# Patient Record
Sex: Male | Born: 1995 | Race: White | Hispanic: Yes | Marital: Single | Smoking: Current some day smoker
Health system: Southern US, Community
[De-identification: ages and names within clinical notes are randomized; demographics above are authoritative.]

---
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
## 2015-09-09 ENCOUNTER — Encounter (HOSPITAL_COMMUNITY): Payer: Self-pay | Admitting: Emergency Medicine

## 2015-09-09 DIAGNOSIS — R000 Tachycardia, unspecified: Secondary | ICD-10-CM | POA: Insufficient documentation

## 2015-09-09 DIAGNOSIS — J020 Streptococcal pharyngitis: Secondary | ICD-10-CM | POA: Insufficient documentation

## 2015-09-09 LAB — RAPID STREP SCREEN (MED CTR MEBANE ONLY): Streptococcus, Group A Screen (Direct): POSITIVE — AB

## 2015-09-09 MED ORDER — ACETAMINOPHEN 325 MG PO TABS
ORAL_TABLET | ORAL | Status: AC
  Filled 2015-09-09: qty 2

## 2015-09-09 MED ORDER — ACETAMINOPHEN 325 MG PO TABS
650.0000 mg | ORAL_TABLET | Freq: Once | ORAL | Status: AC | PRN
  Administered 2015-09-09: 650 mg via ORAL

## 2015-09-09 NOTE — ED Notes (Signed)
Pt c/o sore throat x 1 week

## 2015-09-10 ENCOUNTER — Emergency Department (HOSPITAL_COMMUNITY): Payer: Self-pay

## 2015-09-10 ENCOUNTER — Encounter (HOSPITAL_COMMUNITY): Payer: Self-pay | Admitting: Radiology

## 2015-09-10 ENCOUNTER — Emergency Department (HOSPITAL_COMMUNITY)
Admission: EM | Admit: 2015-09-10 | Discharge: 2015-09-10 | Disposition: A | Discharge status: Home / Self Care | Payer: Self-pay | Attending: Emergency Medicine | Admitting: Emergency Medicine

## 2015-09-10 DIAGNOSIS — J020 Streptococcal pharyngitis: Secondary | ICD-10-CM

## 2015-09-10 LAB — CBC WITH DIFFERENTIAL/PLATELET
BASOS ABS: 0 10*3/uL (ref 0.0–0.1)
Basophils Relative: 0 %
Eosinophils Absolute: 0.1 10*3/uL (ref 0.0–0.7)
Eosinophils Relative: 1 %
HCT: 43.9 % (ref 39.0–52.0)
Hemoglobin: 15.1 g/dL (ref 13.0–17.0)
LYMPHS ABS: 2.4 10*3/uL (ref 0.7–4.0)
Lymphocytes Relative: 18 %
MCH: 29.6 pg (ref 26.0–34.0)
MCHC: 34.4 g/dL (ref 30.0–36.0)
MCV: 86.1 fL (ref 78.0–100.0)
MONO ABS: 1.1 10*3/uL — AB (ref 0.1–1.0)
MONOS PCT: 8 %
NEUTROS PCT: 73 %
Neutro Abs: 9.7 10*3/uL — ABNORMAL HIGH (ref 1.7–7.7)
PLATELETS: 316 10*3/uL (ref 150–400)
RBC: 5.1 MIL/uL (ref 4.22–5.81)
RDW: 12.2 % (ref 11.5–15.5)
WBC: 13.3 10*3/uL — AB (ref 4.0–10.5)

## 2015-09-10 LAB — BASIC METABOLIC PANEL
Anion gap: 11 (ref 5–15)
BUN: 12 mg/dL (ref 6–20)
CALCIUM: 9.4 mg/dL (ref 8.9–10.3)
CHLORIDE: 103 mmol/L (ref 101–111)
CO2: 24 mmol/L (ref 22–32)
CREATININE: 0.92 mg/dL (ref 0.61–1.24)
GFR calc non Af Amer: >60 mL/min (ref >60)
Glucose, Bld: 95 mg/dL (ref 65–99)
Potassium: 4.1 mmol/L (ref 3.5–5.1)
Sodium: 138 mmol/L (ref 135–145)

## 2015-09-10 MED ORDER — SODIUM CHLORIDE 0.9 % IV BOLUS (SEPSIS)
1000.0000 mL | Freq: Once | INTRAVENOUS | Status: AC
  Administered 2015-09-10: 1000 mL via INTRAVENOUS

## 2015-09-10 MED ORDER — CLINDAMYCIN HCL 300 MG PO CAPS: 300.0000 mg | ORAL_CAPSULE | Freq: Four times a day (QID) | ORAL | Status: AC

## 2015-09-10 MED ORDER — IOHEXOL 300 MG/ML  SOLN
75.0000 mL | Freq: Once | INTRAMUSCULAR | Status: AC | PRN
  Administered 2015-09-10: 100 mL via INTRAVENOUS

## 2015-09-10 MED ORDER — CLINDAMYCIN PHOSPHATE 600 MG/50ML IV SOLN
600.0000 mg | Freq: Once | INTRAVENOUS | Status: AC
  Administered 2015-09-10: 600 mg via INTRAVENOUS
  Filled 2015-09-10: qty 50

## 2015-09-10 NOTE — Discharge Instructions (Signed)

## 2015-09-10 NOTE — ED Notes (Signed)
Pt will be moved, peritonsillar abscess

## 2015-09-10 NOTE — ED Provider Notes (Signed)
CSN: [id number]     Arrival date & time 09/09/15  2234 History   First MD Initiated Contact with Patient 09/10/15 0058     Chief Complaint  Patient presents with  . Sore Throat   (Consider location/radiation/quality/duration/timing/severity/associated sxs/prior Treatment) Patient is a 20 y.o. male presenting with pharyngitis. The history is provided by the patient. No language interpreter was used.  Sore Throat Associated symptoms include a fever and a sore throat.    Dennis Christian is a 20 year old male with no significant past medical history who presents for gradual onset and worsening sore throat 1 week. He also reports a fever. Worse with swallowing. He denies any other symptoms including cough, ear pain, shortness of breath. Denies having this in the past. States he took DayQuil, NyQuil, and Aleve with minimal relief.   History reviewed. No pertinent past medical history. History reviewed. No pertinent past surgical history. No family history on file. Social History  Substance Use Topics  . Smoking status: Never Smoker   . Smokeless tobacco: None  . Alcohol Use: No    Review of Systems  Constitutional: Positive for fever.  HENT: Positive for sore throat.   All other systems reviewed and are negative.     Allergies  Review of patient's allergies indicates no known allergies.  Home Medications   Prior to Admission medications   Not on File   BP 145/82 mmHg  Pulse 95  Temp(Src) 98.8 F (37.1 C) (Oral)  Resp 18  Wt 102.921 kg  SpO2 100% Physical Exam  Constitutional: He is oriented to person, place, and time. He appears well-developed and well-nourished.  HENT:  Head: Normocephalic and atraumatic.  Bilateral tonsillar edema and erythema but no kissing tonsils. No hot potato voice. No drooling or trismus. He has fullness and erythema of the posterior left sided soft palate near the tonsil. Uvula midline. No anterior cervical lymphadenopathy. No difficulty handling  secretions.  Eyes: Conjunctivae are normal.  Neck: Normal range of motion. Neck supple.  Cardiovascular: Tachycardia present.   No murmur heard. Pulmonary/Chest: Effort normal and breath sounds normal. No respiratory distress. He has no wheezes. He has no rales.  Musculoskeletal: Normal range of motion.  Lymphadenopathy:    He has no cervical adenopathy.  Neurological: He is alert and oriented to person, place, and time.  Skin: Skin is warm and dry.  Nursing note and vitals reviewed.   ED Course  Procedures (including critical care time) Labs Review Labs Reviewed  RAPID STREP SCREEN (NOT AT [facility]) - Abnormal; Notable for the following:    Streptococcus, Group A Screen (Direct) POSITIVE (*)    All other components within normal limits  CBC WITH DIFFERENTIAL/PLATELET  BASIC METABOLIC PANEL    Imaging Review No results found. I have personally reviewed and evaluated these images and lab results as part of my medical decision-making.   EKG Interpretation None      MDM   Final diagnoses:  Strep pharyngitis  Patient presents for fever and sore throat 1 week. No other symptoms. Tolerating his own secretions. Uvula is midline. Concerns for peritonsillar abscess due to soft palate erythema. Patient had temperature of 100.3. He was started on IV fluids and IV antibiotics. I discussed findings with the patient as well as need for CT.  I discussed this patient with Dr. Bebe Shaggy who is seen and evaluated the patient. He is aware that CT is pending and will follow up. Medications  clindamycin (CLEOCIN) IVPB 600 mg (600 mg Intravenous New  Bag/Given 09/10/15 0146)  acetaminophen (TYLENOL) tablet 650 mg (650 mg Oral Given 09/09/15 2259)  sodium chloride 0.9 % bolus 1,000 mL (1,000 mLs Intravenous New Bag/Given 09/10/15 0120)   Filed Vitals:   09/09/15 2253 09/10/15 0103  BP: 146/83 145/82  Pulse: 120 95  Temp: 100.3 F (37.9 C) 98.8 F (37.1 C)  Resp: 16 [address]18       Dennis Dunton  Patel-Mills, Dennis Christian 09/10/15 0148  Zadie Rhineonald Wickline, MD 09/10/15 [phone]

## 2015-09-10 NOTE — ED Notes (Signed)
See EDP assessment 

## 2016-05-23 ENCOUNTER — Encounter (HOSPITAL_COMMUNITY): Payer: Self-pay | Admitting: Emergency Medicine

## 2016-05-23 ENCOUNTER — Emergency Department (HOSPITAL_COMMUNITY)
Admission: EM | Admit: 2016-05-23 | Discharge: 2016-05-23 | Disposition: A | Discharge status: Home / Self Care | Payer: Medicaid Other | Attending: Emergency Medicine | Admitting: Emergency Medicine

## 2016-05-23 DIAGNOSIS — R040 Epistaxis: Secondary | ICD-10-CM | POA: Insufficient documentation

## 2016-05-23 MED ORDER — OXYMETAZOLINE HCL 0.05 % NA SOLN
1.0000 | Freq: Once | NASAL | Status: AC
  Administered 2016-05-23: 1 via NASAL
  Filled 2016-05-23: qty 15

## 2016-05-23 MED ORDER — LIDOCAINE-EPINEPHRINE-TETRACAINE (LET) SOLUTION
3.0000 mL | Freq: Once | NASAL | Status: AC
  Administered 2016-05-23: 3 mL via TOPICAL
  Filled 2016-05-23: qty 3

## 2016-05-23 NOTE — ED Notes (Signed)
[facility] COUPON 209 GIVEN

## 2016-05-23 NOTE — Discharge Instructions (Signed)
Nosebleeds commonly recur.  If your nose starts bleeding again and doesn't stop after 15-20 minutes of constant pressure squeezing the soft part of your nose, then re-apply the direct pressure and return to the ED. Return also immediately to the nearest ED if you develop chest pain, shortness of breath, dizziness or fainting, severe bleeding, or other concerns.  Nasal Hygiene The nose has many positive effects on the air you breathe in that you may not be aware of. - Temperature regulation - Filtration and removal of particulate matter - Humidification - Defense against infections There are several things you can do to help keep your nose healthy. Foremost is nasal hygiene. This will help with your nose's natural function and keep it moist and healthy. Techniques to accomplish this are outlined below. These will help with nasal dryness, nasal crusting, and nose bleeds. They also assist with clearing thick mucus that cause you to blow your nose frequently and may be associated with thick postnasal drip.  1. Use nasal saline daily. You can buy small bottles of this over-the-counter at the drug store or grocery store. Some brand names are: [address], [employer] Mist, [place]. Apply 2-3 sprays each nostril several times a day. If your nose feels dry or have had recent nasal surgery, try to use it every couple of hours. There is no medicine in it so it can be used as often as you like. Do NOT use the sprays containing decongestants. The appropriate way to apply nasal sprays: Place the nozzle just inside your nostril and point it towards the corner of your eye. Often, it is helpful to use the right-hand to spray into the left nasal cavity and use the left-hand to spray into the right side.  2. Use nasal saline irrigations and flushing.SinuCleanse, Simply Saline, Ayr. Use this 1-2 times a day. We can also provide you with a recipe to use at home. Just ask us. To prevent reintroducing bacteria back into  your nose, please keep your irrigation equipment clean and dry between uses. Throw away and replace reusable irrigation equipment every 3 weeks.   3. Use Vaseline petroleum jelly or Aquaphor. You can apply this gently to each nostril 2-3 times a day to promote moisturization for your nose. You may also use triple antibiotic ointment such as Neosporin or Bacitracin. These can all be bought over-the-counter.  4. Consider other nasal emollients. A few preparations are available over-thecounter. Ponaris, Nose Better, Pretz. Ask your pharmacist what is available. Also, some nasal saline sprays have additives such as aloe and these are helpful.  5. Consider using a humidifier at home. If your nose feels dry and/or you have frequent nose bleeds, you can buy a humidifier for your home. Be cautious in using these if you have mold allergies.  6. Avoid excessive manual manipulation of your nose and nostrils. Frequent rubbing of your nostrils and the passing of tissues or fingers in your nostrils may aggravate nasal irritation from dryness and nose bleeds.

## 2016-05-23 NOTE — ED Provider Notes (Signed)
[facility] Provider Note   CSN: [id number] Arrival date & time: 05/23/16  1038   By signing my name below, I, Avnee Patel, attest that this documentation has been prepared under the direction and in the presence of  Arthor CaptainAbigail Kortlynn Poust, PA-C. Electronically Signed: Clovis PuAvnee Patel, ED Scribe. 05/23/16. 10:58 AM.   History   Chief Complaint Chief Complaint  Patient presents with  . Epistaxis   The history is provided by the patient. No language interpreter was used.   HPI Comments:  Dennis Christian is a 20 y.o. male, with a hx of frequent nose bleeds, who presents to the Emergency Department complaining of sudden onset, persistent epistaxis which began around 6:20 AM today. Pt states he has about 2-3 nosebleeds every week during the winter time. No alleviating factors noted. Pt denies being on blood thinners, cocaine use, any other associated symptoms and modifying factors at this time.    History reviewed. No pertinent past medical history.  There are no active problems to display for this patient.   History reviewed. No pertinent surgical history.  Home Medications    Prior to Admission medications   Medication Sig Start Date End Date Taking? Authorizing Provider  clindamycin (CLEOCIN) 300 MG capsule Take 1 capsule (300 mg total) by mouth 4 (four) times daily. X 7 days 09/10/15   Zadie Rhineonald Wickline, MD  Pseudoephedrine-APAP-DM (DAYQUIL PO) Take 2 tablets by mouth daily as needed (cold).    Historical Provider, MD    Family History No family history on file.  Social History Social History  Substance Use Topics  . Smoking status: Never Smoker  . Smokeless tobacco: Never Used  . Alcohol use No     Allergies   Patient has no known allergies.   Review of Systems Review of Systems  HENT: Positive for nosebleeds and postnasal drip.   Neurological: Negative for headaches.   Physical Exam Updated Vital Signs BP 141/82 (BP Location: Left Arm)   Pulse 83   Temp 98.2 F  (36.8 C) (Oral)   Resp 16   SpO2 100%   Physical Exam  Constitutional: He is oriented to person, place, and time. He appears well-developed and well-nourished. No distress.  HENT:  Head: Normocephalic and atraumatic.  4-5 cm blood clot out of left nare. Blood on back of pharynx. Skin irritation and dilated Kiesselbach plexus   Eyes: Conjunctivae are normal.  Cardiovascular: Normal rate.   Pulmonary/Chest: Effort normal.  Abdominal: He exhibits no distension.  Neurological: He is alert and oriented to person, place, and time.  Skin: Skin is warm and dry.  Psychiatric: He has a normal mood and affect.  Nursing note and vitals reviewed.  ED Treatments / Results  DIAGNOSTIC STUDIES:  Oxygen Saturation is 100% on RA, normal by my interpretation.    COORDINATION OF CARE:  10:54 AM Discussed treatment plan with pt at bedside and pt agreed to plan.  Labs (all labs ordered are listed, but only abnormal results are displayed) Labs Reviewed - No data to display  EKG  EKG Interpretation None       Radiology No results found.  Procedures .Epistaxis Management Date/Time: 05/23/2016 11:00 AM Performed by: Arthor CaptainHARRIS, Elexia Friedt Authorized by: Arthor CaptainHARRIS, Palmer Fahrner   Consent:    Consent obtained:  Verbal   Consent given by:  Patient Anesthesia (see MAR for exact dosages):    Anesthesia method:  Topical application   Topical anesthetic:  LET Procedure details:    Treatment site:  L anterior   Treatment  method:  Silver nitrate   Treatment complexity:  Limited   Treatment episode: initial   Post-procedure details:    Assessment:  Bleeding stopped   Patient tolerance of procedure:  Tolerated well, no immediate complications     (including critical care time)  Medications Ordered in ED Medications  oxymetazoline (AFRIN) 0.05 % nasal spray 1 spray (1 spray Each Nare Given 05/23/16 1056)  lidocaine-EPINEPHrine-tetracaine (LET) solution (3 mLs Topical Given 05/23/16 1115)      Initial Impression / Assessment and Plan / ED Course  I have reviewed the triage vital signs and the nursing notes.  Pertinent labs & imaging results that were available during my care of the patient were reviewed by me and considered in my medical decision making (see chart for details).  Clinical Course    Patient with left anterior epistaxis. Bleeding resolved after pressure and Afrin. KeISELBACH'S region cauterized with silver nitrate. Patient tolerated the procedure well. Discussed nasal hygiene, home care, results to seek immediate medical attention at the emergency department, and follow-up care with ENT. Patient expresses understanding and agrees with plan of care. Pierce safe for discharge at this time  Final Clinical Impressions(s) / ED Diagnoses   Final diagnoses:  Left-sided epistaxis  Anterior epistaxis    New Prescriptions New Prescriptions   No medications on file  I personally performed the services described in this documentation, which was scribed in my presence. The recorded information has been reviewed and is accurate.        Arthor Captainbigail Renarda Mullinix, PA-C 05/23/16 1156    Lavera Guiseana Duo Liu, MD 05/23/16 [phone]

## 2016-05-23 NOTE — ED Triage Notes (Signed)
Pt c/o nose bleed since 0600 today. No injury. No bleeding observed currently. Dried blood in left nare. Wife reports pt has hx of frequent nose bleeds.

## 2016-07-31 ENCOUNTER — Emergency Department (HOSPITAL_COMMUNITY)
Admission: EM | Admit: 2016-07-31 | Discharge: 2016-08-01 | Disposition: A | Discharge status: Home / Self Care | Payer: Medicaid Other | Attending: Emergency Medicine | Admitting: Emergency Medicine

## 2016-07-31 ENCOUNTER — Encounter (HOSPITAL_COMMUNITY): Payer: Self-pay | Admitting: Emergency Medicine

## 2016-07-31 DIAGNOSIS — B349 Viral infection, unspecified: Secondary | ICD-10-CM | POA: Insufficient documentation

## 2016-07-31 MED ORDER — ACETAMINOPHEN 500 MG PO TABS
1000.0000 mg | ORAL_TABLET | Freq: Once | ORAL | Status: AC
  Administered 2016-08-01: 1000 mg via ORAL
  Filled 2016-07-31: qty 2

## 2016-07-31 NOTE — ED Provider Notes (Signed)
[facility] Provider Note   CSN: [id number] Arrival date & time: 07/31/16  4098     History   Chief Complaint Chief Complaint  Patient presents with  . Fever  . Diarrhea  . Generalized Body Aches  . Cough    HPI Dennis Christian is a 21 y.o. male.  HPI   Patient's a 21 year old male with no pertinent past medical history presents the ED with multiple complaints. Patient reports over the past 4 days he has had an intermittent fever, sore throat, productive cough. He reports having 2 episodes of small amount of streaky red sputum yesterday. He also reports having 2 episodes of nonbloody diarrhea 3 nights ago which have since resolved. Patient states he has been taking Mucinex without relief. Endorses recent sick contacts. Denies headache, nasal congestion, rhinorrhea, sinus pressure, facial/eye swelling, trismus, drooling, shortness of breath, chest pain, abdominal pain, nausea, vomiting, urinary symptoms, rash. Patient states he has been able to eat and drink normally at home.  History reviewed. No pertinent past medical history.  There are no active problems to display for this patient.   History reviewed. No pertinent surgical history.     Home Medications    Prior to Admission medications   Medication Sig Start Date End Date Taking? Authorizing Provider  benzonatate (TESSALON) 100 MG capsule Take 2 capsules (200 mg total) by mouth 2 (two) times daily as needed for cough. 08/01/16   Barrett Henle, PA-C  clindamycin (CLEOCIN) 300 MG capsule Take 1 capsule (300 mg total) by mouth 4 (four) times daily. X 7 days Patient not taking: Reported on 07/31/2016 09/10/15   Zadie Rhine, MD  oxymetazoline [facility] NASAL SPRAY) 0.05 % nasal spray Place 1 spray into both nostrils 2 (two) times daily. Spray once into each nostril twice daily for up to the next 3 days. Do not use for more than 3 days to prevent rebound rhinorrhea. 08/01/16   Barrett Henle, PA-C     Family History No family history on file.  Social History Social History  Substance Use Topics  . Smoking status: Never Smoker  . Smokeless tobacco: Never Used  . Alcohol use No     Allergies   Patient has no known allergies.   Review of Systems Review of Systems  Constitutional: Positive for fever.  HENT: Positive for sore throat.   Respiratory: Positive for cough.   Gastrointestinal: Positive for diarrhea.  All other systems reviewed and are negative.    Physical Exam Updated Vital Signs BP 145/71   Pulse 63   Temp 99 F (37.2 C) (Oral)   Resp 16   Wt 104.3 kg   SpO2 100%   Physical Exam  Constitutional: He is oriented to person, place, and time. He appears well-developed and well-nourished. No distress.  HENT:  Head: Normocephalic and atraumatic.  Right Ear: Tympanic membrane normal.  Left Ear: Tympanic membrane normal.  Nose: Nose normal. Right sinus exhibits no maxillary sinus tenderness and no frontal sinus tenderness. Left sinus exhibits no maxillary sinus tenderness and no frontal sinus tenderness.  Mouth/Throat: Uvula is midline and mucous membranes are normal. Posterior oropharyngeal erythema present. No oropharyngeal exudate, posterior oropharyngeal edema or tonsillar abscesses. Tonsils are 1+ on the right. Tonsils are 1+ on the left. Tonsillar exudate.  Mild erythema and exudate noted to bilateral tonsils.  No trismus, drooling, facial/neck swelling or stridor on exam. No muffled voice. Floor of mouth soft.    Eyes: Conjunctivae and EOM are normal. Pupils are  equal, round, and reactive to light. Right eye exhibits no discharge. Left eye exhibits no discharge. No scleral icterus.  Neck: Normal range of motion. Neck supple.  Cardiovascular: Normal rate, regular rhythm, normal heart sounds and intact distal pulses.   Pulmonary/Chest: Effort normal and breath sounds normal. No respiratory distress. He has no wheezes. He has no rales. He exhibits no  tenderness.  Abdominal: Soft. Bowel sounds are normal. He exhibits no distension and no mass. There is no tenderness. There is no rebound and no guarding. No hernia.  Musculoskeletal: Normal range of motion. He exhibits no edema.  Lymphadenopathy:    He has no cervical adenopathy.  Neurological: He is alert and oriented to person, place, and time.  Skin: Skin is warm and dry. He is not diaphoretic.  Nursing note and vitals reviewed.    ED Treatments / Results  Labs (all labs ordered are listed, but only abnormal results are displayed) Labs Reviewed  RAPID ST[facility] SCREEN [facility])    EKG  EKG Interpretation None       Radiology No results found.  Procedures Procedures (including critical care time)  Medications Ordered in ED Medications  acetaminophen (TYLENOL) tablet 1,000 mg (1,000 mg Oral Given 08/01/16 0046)     Initial Impression / Assessment and Plan / ED Course  I have reviewed the triage vital signs and the nursing notes.  Pertinent labs & imaging results that were available during my care of the patient were reviewed by me and considered in my medical decision making (see chart for details).     Patient presents with history of fever, sore throat, cough and diarrhea which have been present over the past 4 days. Reports recent sick contacts. VSS. Exam revealed mild erythema and exudate noted to bilateral tonsils. No trismus, drooling, facial/neck swelling or stridor on exam. No muffled voice. Floor of mouth soft. Lungs CTAB. No abdominal tenderness. Remaining exam unremarkable. Patient given Tylenol in the ED. Strep pending.  [facility], PA-C. Pending strep. If strep is negative, suspect patient's symptoms are likely due to viral illness. Plan to discharge patient home with symptomatic treatment including antitussive, decongestion and antipyretics for fever and body aches. Recommend continued oral hydration and PCP follow-up as needed.  Final  Clinical Impressions(s) / ED Diagnoses   Final diagnoses:  Viral illness    New Prescriptions New Prescriptions   BENZONATATE (TESSALON) 100 MG CAPSULE    Take 2 capsules (200 mg total) by mouth 2 (two) times daily as needed for cough.   OXYMETAZOLINE (AFRIN NASAL SPRAY) 0.05 % NASAL SPRAY    Place 1 spray into both nostrils 2 (two) times daily. Spray once into each nostril twice daily for up to the next 3 days. Do not use for more than 3 days to prevent rebound rhinorrhea.     Satira Sarkicole Elizabeth [place]aks[place]adeau, [place] JerseyPA-C 08/01/16 [id number]    Geoffery Lyonsouglas Delo, MD 08/01/16 0530

## 2016-07-31 NOTE — ED Notes (Signed)
Warm blankets handed out and delay explanation provided to all patients in lobby.   

## 2016-07-31 NOTE — ED Triage Notes (Signed)
Patient reports fever with generalized body aches , diarrhea , blood tinged productive cough , rhinorrhea /nasal congestion onset this week .

## 2016-08-01 LAB — RAPID STREP SCREEN (MED CTR MEBANE ONLY): Streptococcus, Group A Screen (Direct): NEGATIVE

## 2016-08-01 MED ORDER — BENZONATATE 100 MG PO CAPS: 200.0000 mg | ORAL_CAPSULE | Freq: Two times a day (BID) | ORAL | 0 refills | Status: AC | PRN

## 2016-08-01 MED ORDER — OXYMETAZOLINE HCL 0.05 % NA SOLN: 1.0000 | Freq: Two times a day (BID) | NASAL | 0 refills | Status: AC

## 2016-08-01 NOTE — ED Provider Notes (Signed)
12:48 AM Pt signed out to me at change of shift from [facility] Nadeau, [place]PA-C.  Pending strep screen.  Anticipate d/c home.    Strep screen negative.  D/C home.    Results for orders placed or performed during the hospital encounter of 07/31/16  Rapid strep screen  Result Value Ref Range   Streptococcus, Group A Screen (Direct) NEGATIVE NEGATIVE   No results found.    [place]stonEmily Caretha Rumbaugh, PA-C 08/01/16 0149    Geoffery Lyonsouglas Delo, MD 08/01/16 [phone]

## 2016-08-01 NOTE — Discharge Instructions (Addendum)
Take your medications as prescribed. I also recommend taking Tylenol and ibuprofen as prescribed over-the-counter, alternating between doses every 3-4 hours. Continue drinking fluids at home to remain hydrated. I recommend eating a bland diet for the next few days and taper symptoms have improved. Follow-up with your primary care provider in the next 3-4 days if symptoms have not improved. Return to the emergency department if symptoms worsen or new onset of headache, neck stiffness, difficulty breathing, coughing up blood, chest pain, abdominal pain, vomiting, unable to keep fluids down.   Your strep screening test is negative.  They will perform a culture and will call you if it is positive and you need antibiotics.

## 2016-08-03 LAB — CULTURE, GROUP A STREP (THRC)

## 2016-08-04 ENCOUNTER — Telehealth: Payer: Self-pay | Admitting: Emergency Medicine

## 2016-08-04 NOTE — Telephone Encounter (Signed)
Post ED Visit - Positive Culture Follow-up: Successful Patient Follow-Up  Culture assessed and recommendations reviewed by: [x]  Enzo BiNathan Batchelder, Pharm.D. []  Celedonio MiyamotoJeremy Frens, Pharm.D., BCPS []  Garvin FilaMike Maccia, Pharm.D. []  Georgina PillionElizabeth Martin, Pharm.D., BCPS []  HarwickMinh Pham, [address] BoulevardPharm.D., BCPS, AAHIVP []  Estella HuskMichelle Turner, Pharm.D., BCPS, AAHIVP []  Tennis Mustassie Stewart, Pharm.D. []  Sherle Poeob Vincent, [address]Pharm.D.  Positive strep culture  [x]  Patient discharged without antimicrobial prescription and treatment is now indicated []  Organism is resistant to prescribed ED discharge antimicrobial []  Patient with positive blood cultures  Changes discussed with ED provider: Sharilyn SitesLisa Sanders PA New antibiotic prescription start Amoxicillin 500mg  po bid x 10 days   Attempting to contact patient   Berle MullMiller, Truth Barot 08/04/2016, 2:02 PM

## 2016-09-09 ENCOUNTER — Encounter (HOSPITAL_COMMUNITY): Payer: Self-pay

## 2016-09-09 ENCOUNTER — Emergency Department (HOSPITAL_COMMUNITY)
Admission: EM | Admit: 2016-09-09 | Discharge: 2016-09-09 | Disposition: A | Discharge status: Home / Self Care | Payer: Medicaid Other | Attending: Emergency Medicine | Admitting: Emergency Medicine

## 2016-09-09 DIAGNOSIS — H65192 Other acute nonsuppurative otitis media, left ear: Secondary | ICD-10-CM | POA: Insufficient documentation

## 2016-09-09 DIAGNOSIS — F17200 Nicotine dependence, unspecified, uncomplicated: Secondary | ICD-10-CM | POA: Insufficient documentation

## 2016-09-09 MED ORDER — AMOXICILLIN 500 MG PO CAPS: 500.0000 mg | ORAL_CAPSULE | Freq: Three times a day (TID) | ORAL | 0 refills | Status: AC

## 2016-09-09 NOTE — ED Triage Notes (Signed)
Onset 4 days ago headache, left ear pain.  NO ear drainage, fever.

## 2016-09-09 NOTE — ED Provider Notes (Signed)
[facility]-EMERGENCY DEPT Provider Note    By signing my name below, I, Earmon PhoenixJennifer Waddell, attest that this documentation has been prepared under the direction and in the presence of Hayleen Clinkscales, PA-C. Electronically Signed: Earmon PhoenixJennifer Waddell, ED Scribe. 09/09/16. 11:59 PM.    History   Chief Complaint Chief Complaint  Patient presents with  . Otalgia   The history is provided by the patient and medical records. No language interpreter was used.    Dennis Christian is a 21 y.o. male who presents to the Emergency Department complaining of left ear pain that began four days ago. He reports associated HA, back pain, mild rhinorrhea, pressure from chest congestion and sore throat. He reports some intermittent dizziness, lasting approximately 7 seconds at a time, that has resolved on its own. His children are sick at home with some similar symptoms. He has taken Tylenol for his symptoms but has not taken any today. He denies modifying factors. He denies fever, chills, cough, sneezing, body aches, abdominal pain, nausea, vomiting, diarrhea, constipation, palpitations.   History reviewed. No pertinent past medical history.  There are no active problems to display for this patient.   History reviewed. No pertinent surgical history.     Home Medications    Prior to Admission medications   Medication Sig Start Date End Date Taking? Authorizing Provider  amoxicillin (AMOXIL) 500 MG capsule Take 1 capsule (500 mg total) by mouth 3 (three) times daily. 09/09/16   Elson AreasLeslie K Sofia, PA-C  benzonatate (TESSALON) 100 MG capsule Take 2 capsules (200 mg total) by mouth 2 (two) times daily as needed for cough. 08/01/16   Barrett HenleNicole Elizabeth Nadeau, PA-C  clindamycin (CLEOCIN) 300 MG capsule Take 1 capsule (300 mg total) by mouth 4 (four) times daily. X 7 days Patient not taking: Reported on 07/31/2016 09/10/15   Zadie Rhineonald Wickline, MD  oxymetazoline [facility](AFRIN NASAL SPRAY) 0.05 % nasal spray Place 1 spray into both nostrils  2 (two) times daily. Spray once into each nostril twice daily for up to the next 3 days. Do not use for more than 3 days to prevent rebound rhinorrhea. 08/01/16   Barrett HenleNicole Elizabeth Nadeau, PA-C    Family History History reviewed. No pertinent family history.  Social History Social History  Substance Use Topics  . Smoking status: Current Some Day Smoker  . Smokeless tobacco: Never Used  . Alcohol use Yes     Comment: occ      Allergies   Patient has no known allergies.   Review of Systems Review of Systems  Constitutional: Positive for fatigue. Negative for appetite change, chills and fever.  HENT: Positive for congestion, ear pain, rhinorrhea and sore throat. Negative for sneezing.   Eyes: Negative for visual disturbance.  Respiratory: Negative for cough, chest tightness and shortness of breath.   Cardiovascular: Negative for palpitations.  Gastrointestinal: Negative for abdominal pain, constipation, diarrhea, nausea and vomiting.  Genitourinary: Negative for dysuria.  Musculoskeletal: Negative for myalgias.  Skin: Negative for rash.  Neurological: Positive for dizziness and headaches.  Hematological: Negative for adenopathy.  All other systems reviewed and are negative.    Physical Exam Updated Vital Signs BP 135/67   Pulse 69   Temp 98.4 F (36.9 C) (Oral)   Resp 16   SpO2 100%   Physical Exam  Constitutional: He is oriented to person, place, and time. He appears well-developed and well-nourished. No distress.  HENT:  Head: Normocephalic and atraumatic.  Right Ear: External ear normal. No drainage.  Left Ear:  External ear normal. No drainage. Tympanic membrane is erythematous. Tympanic membrane is not injected.  Nose: Nose normal.  Mouth/Throat: Uvula is midline and mucous membranes are normal. Posterior oropharyngeal edema present. No oropharyngeal exudate, posterior oropharyngeal erythema or tonsillar abscesses.  Fluid behind both TM. Landmarks hard to discern  in L ear.  Eyes: Conjunctivae and EOM are normal. Pupils are equal, round, and reactive to light. No scleral icterus.  Neck: Normal range of motion. Neck supple.  Cardiovascular: Normal rate, regular rhythm and normal heart sounds.  Exam reveals no gallop and no friction rub.   No murmur heard. Pulmonary/Chest: Effort normal and breath sounds normal. No respiratory distress. He has no wheezes. He has no rales.  Abdominal: Soft. Bowel sounds are normal. There is no tenderness.  Musculoskeletal: Normal range of motion.  Lymphadenopathy:    He has no cervical adenopathy.  Neurological: He is alert and oriented to person, place, and time. No sensory deficit. He exhibits normal muscle tone.  Skin: Skin is warm and dry. He is not diaphoretic.  Psychiatric: He has a normal mood and affect.  Nursing note and vitals reviewed.    ED Treatments / Results  DIAGNOSTIC STUDIES: Oxygen Saturation is 100% on RA, normal by my interpretation.   COORDINATION OF CARE: 5:27 PM- Pt verbalizes understanding and agrees to plan.  Medications - No data to display  Labs (all labs ordered are listed, but only abnormal results are displayed) Labs Reviewed - No data to display  EKG  EKG Interpretation None       Radiology No results found.  Procedures Procedures (including critical care time)  Medications Ordered in ED Medications - No data to display   Initial Impression / Assessment and Plan / ED Course  I have reviewed the triage vital signs and the nursing notes.  Pertinent labs & imaging results that were available during my care of the patient were reviewed by me and considered in my medical decision making (see chart for details).     Due to absence of fever, mylagias or tonsillar exudates, low suspicion for influenza, strep throat or mono. There was considerable fluid and erythema of the L ear. The dizziness could be attributed to the fluid behind ears.  Will be given amoxicillin and  educated on continuing supportive care as needed. Return precautions given.  I personally performed the services described in this documentation, which was scribed in my presence. The recorded information has been reviewed and is accurate.   Final Clinical Impressions(s) / ED Diagnoses   Final diagnoses:  Other acute nonsuppurative otitis media of left ear, recurrence not specified    New Prescriptions Discharge Medication List as of 09/09/2016  5:54 PM    START taking these medications   Details  amoxicillin (AMOXIL) 500 MG capsule Take 1 capsule (500 mg total) by mouth 3 (three) times daily., Starting Tue 09/09/2016, Print         [place], [place] 09/09/16 2359    Marily Memos, MD 09/11/16 [phone]

## 2016-09-09 NOTE — Discharge Instructions (Signed)
Begin taking amoxicillin three times daily. Continue supportive care including Tylenol and ibuprofen as needed. Return to ED for symptoms such as fever, trouble breathing or injury.

## 2016-09-23 ENCOUNTER — Telehealth: Payer: Self-pay | Admitting: Emergency Medicine

## 2016-09-23 NOTE — Telephone Encounter (Signed)
LOST TO FOLLOWUP no response to letter

## 2017-05-01 IMAGING — CT CT NECK W/ CM
4 of 5 series · 15 of 33 positions shown, 17 images · IV contrast (omnipaque)
Comparison: None.

CLINICAL DATA: Initial evaluation for acute sore throat,
pharyngitis, fever.

EXAM:
CT NECK WITH CONTRAST
TECHNIQUE: Multidetector CT imaging of the neck was performed using the
standard protocol following the bolus administration of intravenous
contrast.
CONTRAST:  100mL OMNIPAQUE IOHEXOL 300 MG/ML  SOLN

[Series 2: neck 2.0 st · axial · 0.42mm/px · z∈[-735,-591]mm · 4 of 121 slices shown, 5 images (1 of 3)]
[im 25/121  soft-tissue]
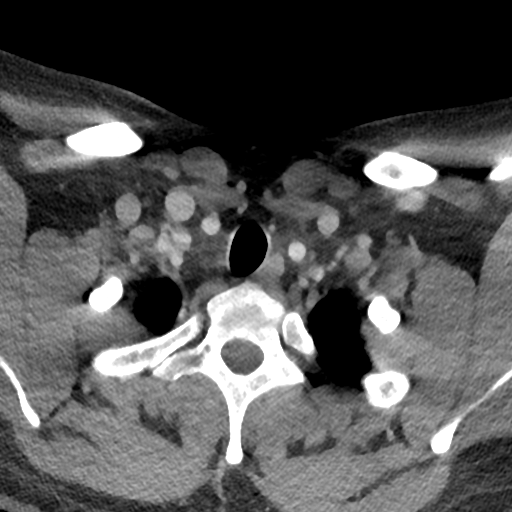
[im 25/121  bone]
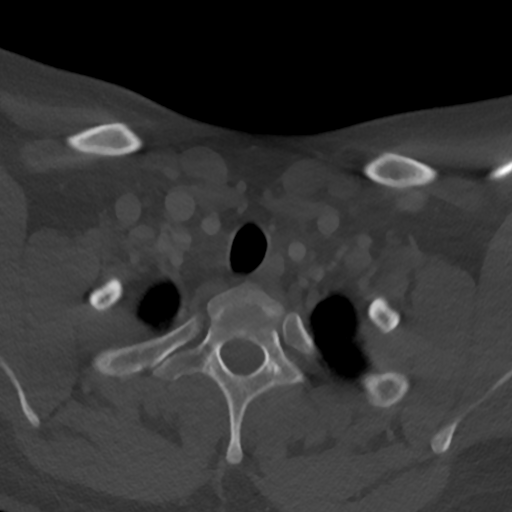
[im 49/121  bone]
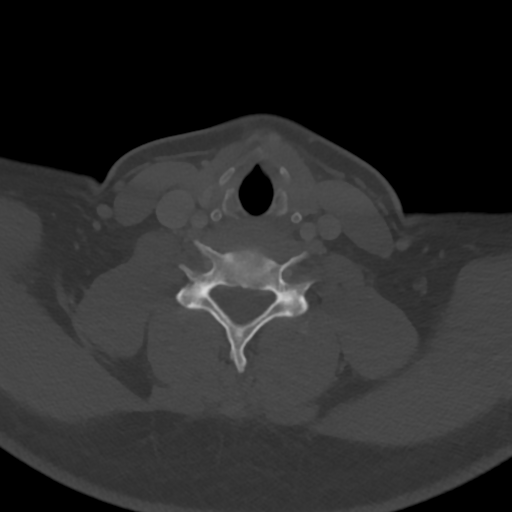
[im 73/121  bone]
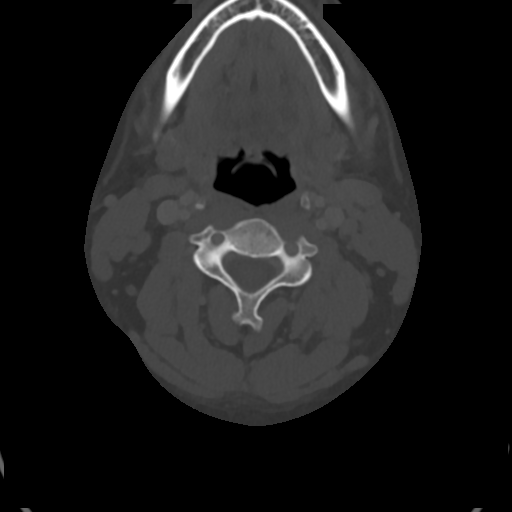
[im 97/121  bone]
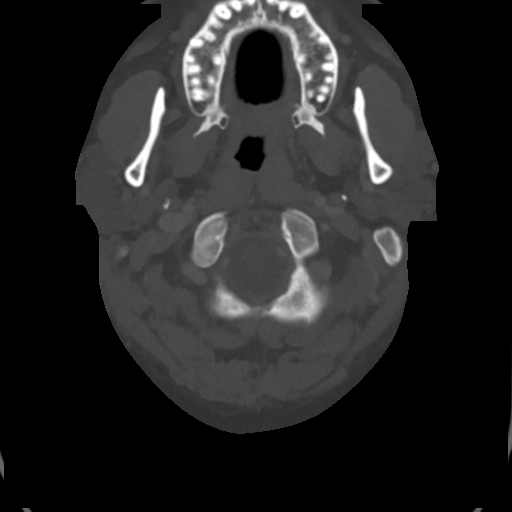

[Series 4: neck 2.0 st · sagittal · 0.47mm/px · 5 of 93 slices shown, 6 images (2 of 3)]
[im 31/93  bone]
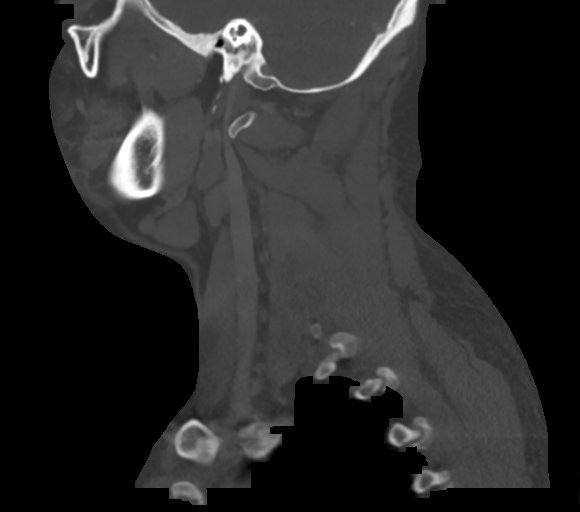
[im 39/93  bone]
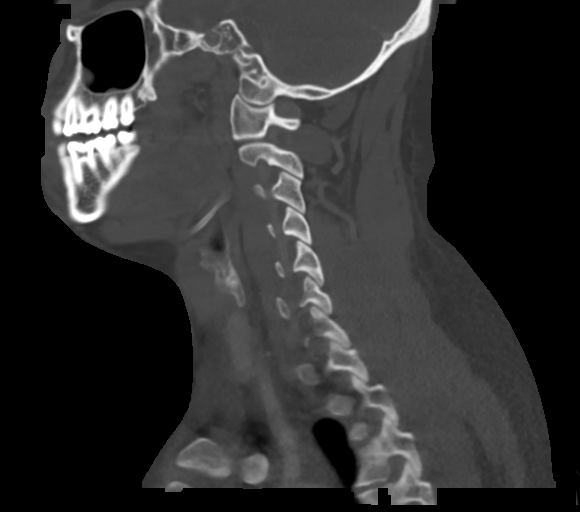
[im 47/93  soft-tissue]
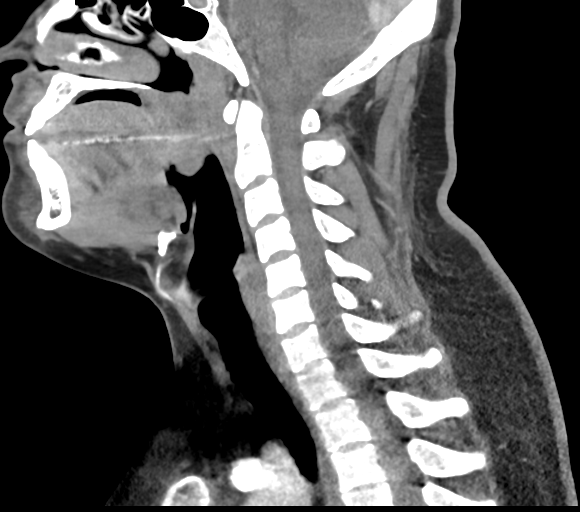
[im 47/93  bone]
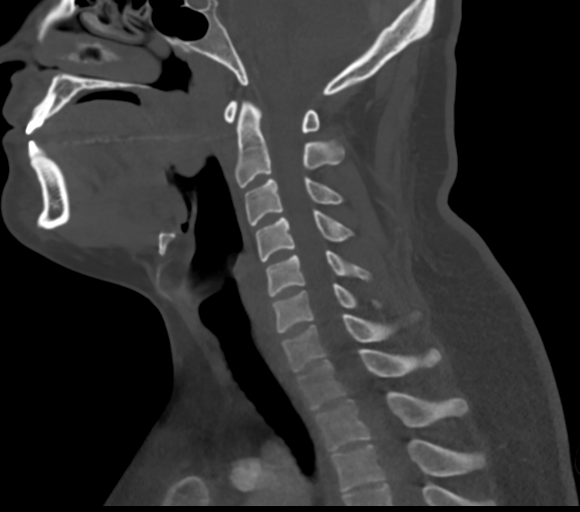
[im 54/93  bone]
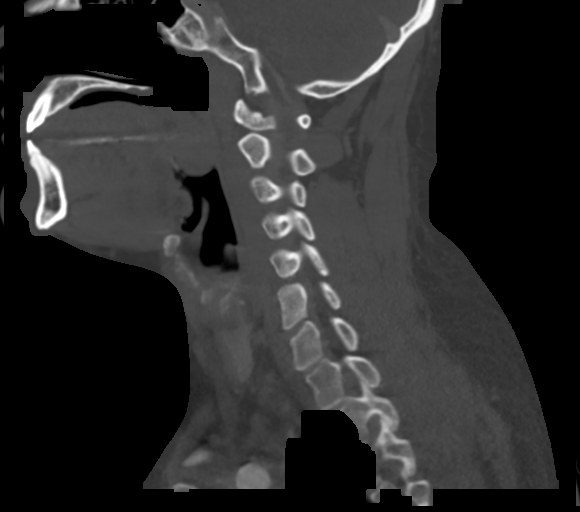
[im 62/93  bone]
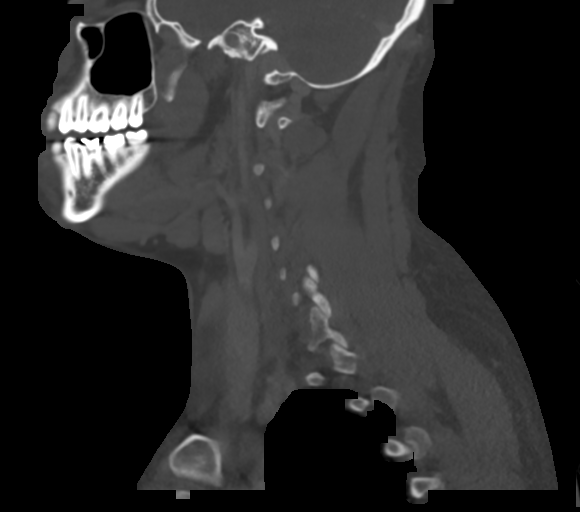

[Series 5: neck 2.0 st · coronal · 0.37mm/px · 3 of 101 slices shown (3 of 3)]
[im 21/101  bone]
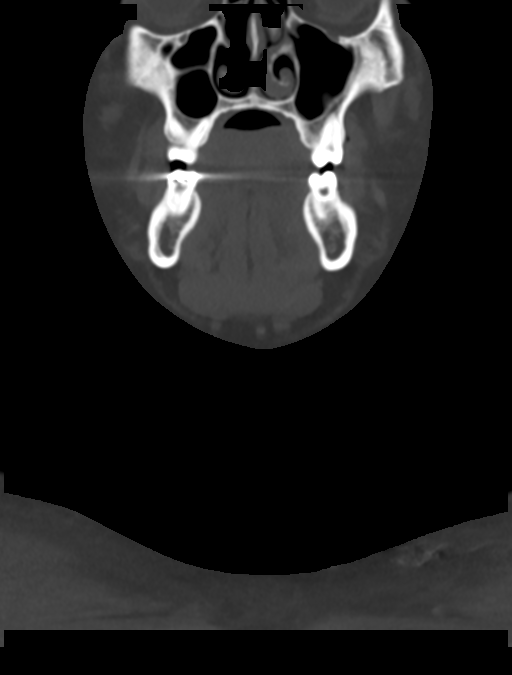
[im 41/101  bone]
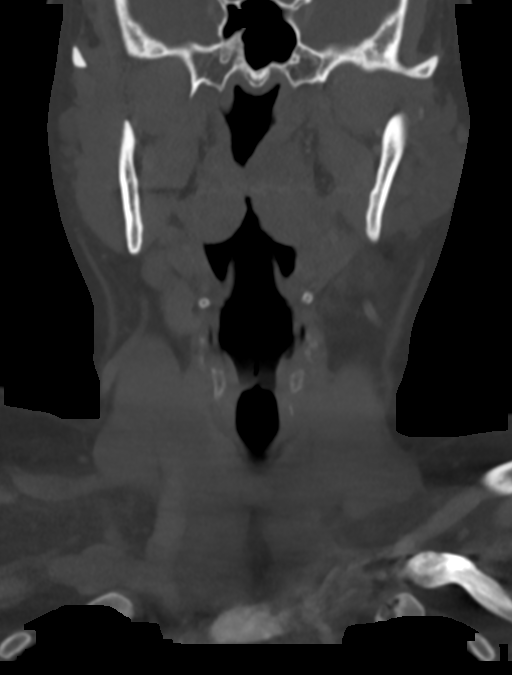
[im 61/101  bone]
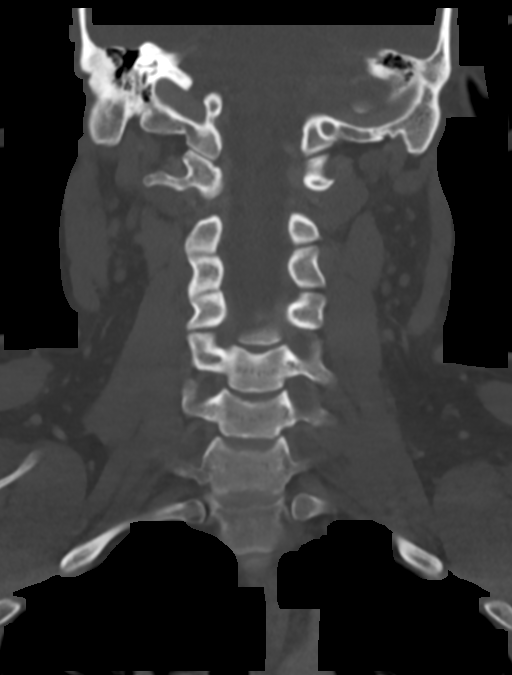

[Series 6: neck 2.0 st orthogonal · axial · 0.39mm/px · z∈[-752,-657]mm · 3 of 120 slices shown]
[im 24/120  bone]
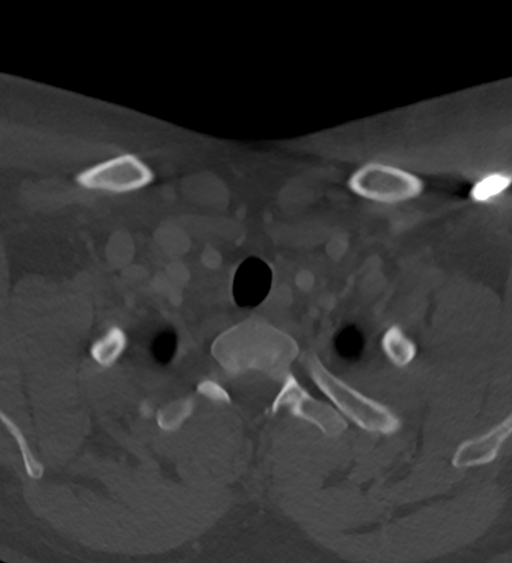
[im 48/120  bone]
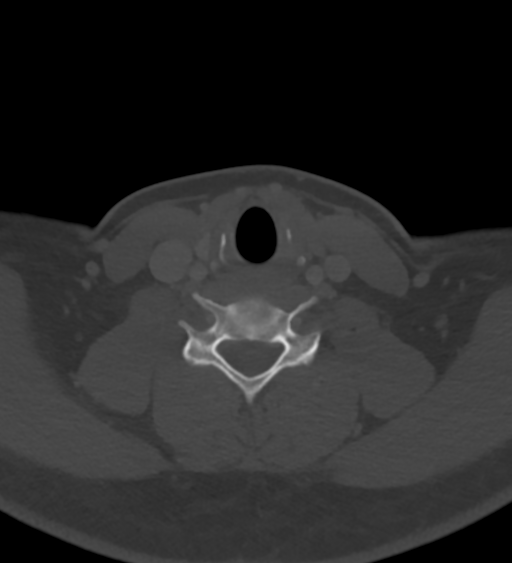
[im 72/120  bone]
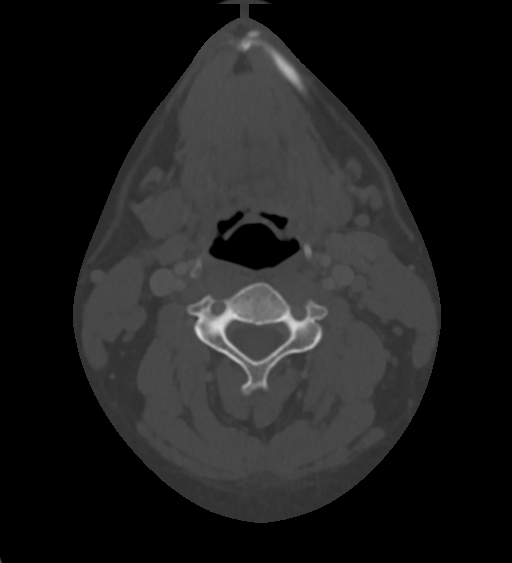

[15 of 33 positions shown; findings below may reference images not displayed]

FINDINGS: Visualized portions of the brain demonstrate no acute abnormality.

Partially visualized globes and orbits within normal limits.

Minimal mucosal thickening within the left maxilla sinus. Visualized
paranasal sinuses are otherwise clear. Visualized mastoids well
pneumatized. Middle ear cavities clear.

Salivary glands including the parotid glands and submandibular
glands are within normal limits.

No acute abnormality about the dentition. Oral cavity within normal
limits.

Palatine tonsils are enlarged and slightly edematous in appearance,
suggesting acute tonsillitis. Parapharyngeal fat preserved. No
peritonsillar abscess. Mild edema within the or pharyngeal airway.
Nasopharynx normal. No retropharyngeal fluid collection. Epiglottis
normal. Vallecular clear. Remainder of the hypopharynx and
supraglottic larynx normal. Mild motion artifact at the level of the
true cords, likely within normal limits. Subglottic airway clear.

Thyroid gland normal.

Mildly enlarged bilateral level 2 lymph nodes, measuring up to 13 mm
on the right and 15 mm on the left, likely reactive. No other
significant adenopathy within the neck.

Thyroid gland normal.

Visualized superior mediastinum within normal limits.

Visualized lungs clear.

Normal intravascular enhancement present within the neck.

No acute osseous abnormality. No worrisome lytic or blastic osseous
lesions.
IMPRESSION: 1. Mild enlargement of the palatine tonsils bilaterally, suggesting
acute tonsillitis. Minimal edema within the adjacent or pharyngeal
airway, consistent with associated pharyngitis. No drainable fluid
collection or abscess.
2. Mildly enlarged bilateral level 2 lymph nodes, likely reactive.
